# Patient Record
Sex: Male | Born: 1950 | Race: White | Hispanic: No | Marital: Single | Smoking: Former smoker
Health system: Southern US, Community
[De-identification: ages and names within clinical notes are randomized; demographics above are authoritative.]

## PROBLEM LIST (undated history)

## (undated) DIAGNOSIS — E7800 Pure hypercholesterolemia, unspecified: Secondary | ICD-10-CM

## (undated) DIAGNOSIS — C801 Malignant (primary) neoplasm, unspecified: Secondary | ICD-10-CM

## (undated) DIAGNOSIS — I10 Essential (primary) hypertension: Secondary | ICD-10-CM

## (undated) DIAGNOSIS — J45909 Unspecified asthma, uncomplicated: Secondary | ICD-10-CM

## (undated) HISTORY — PX: PLEURAL SCARIFICATION: SHX748

## (undated) HISTORY — PX: VASECTOMY: SHX75

---

## 2000-12-18 ENCOUNTER — Other Ambulatory Visit: Admission: RE | Admit: 2000-12-18 | Discharge: 2000-12-18 | Payer: Self-pay | Admitting: Dermatology

## 2012-12-24 ENCOUNTER — Encounter: Payer: Self-pay | Admitting: Nurse Practitioner

## 2013-07-16 HISTORY — PX: PROSTATE BIOPSY: SHX241

## 2013-07-30 ENCOUNTER — Encounter (HOSPITAL_COMMUNITY): Payer: Self-pay | Admitting: Emergency Medicine

## 2013-07-30 ENCOUNTER — Emergency Department (HOSPITAL_COMMUNITY): Payer: Non-veteran care

## 2013-07-30 ENCOUNTER — Emergency Department (HOSPITAL_COMMUNITY)
Admission: EM | Admit: 2013-07-30 | Discharge: 2013-07-30 | Disposition: A | Discharge status: Home / Self Care | Payer: Non-veteran care | Attending: Emergency Medicine | Admitting: Emergency Medicine

## 2013-07-30 DIAGNOSIS — R319 Hematuria, unspecified: Secondary | ICD-10-CM

## 2013-07-30 DIAGNOSIS — R109 Unspecified abdominal pain: Secondary | ICD-10-CM | POA: Insufficient documentation

## 2013-07-30 DIAGNOSIS — I10 Essential (primary) hypertension: Secondary | ICD-10-CM | POA: Insufficient documentation

## 2013-07-30 DIAGNOSIS — Z9889 Other specified postprocedural states: Secondary | ICD-10-CM

## 2013-07-30 DIAGNOSIS — F17200 Nicotine dependence, unspecified, uncomplicated: Secondary | ICD-10-CM | POA: Insufficient documentation

## 2013-07-30 DIAGNOSIS — R300 Dysuria: Secondary | ICD-10-CM | POA: Insufficient documentation

## 2013-07-30 HISTORY — DX: Essential (primary) hypertension: I10

## 2013-07-30 LAB — POCT I-STAT, CHEM 8
Calcium, Ion: 1.23 mmol/L (ref 1.13–1.30)
Chloride: 98 mEq/L (ref 96–112)
Creatinine, Ser: 1.2 mg/dL (ref 0.50–1.35)
Glucose, Bld: 81 mg/dL (ref 70–99)
HCT: 48 % (ref 39.0–52.0)
Potassium: 3.6 mEq/L (ref 3.5–5.1)
Sodium: 140 mEq/L (ref 135–145)
TCO2: 28 mmol/L (ref 0–100)

## 2013-07-30 LAB — URINE MICROSCOPIC-ADD ON

## 2013-07-30 LAB — URINALYSIS, ROUTINE W REFLEX MICROSCOPIC
Bilirubin Urine: NEGATIVE
Urobilinogen, UA: 0.2 mg/dL (ref 0.0–1.0)

## 2013-07-30 MED ORDER — SODIUM CHLORIDE 0.9 % IV SOLN
INTRAVENOUS | Status: DC
  Administered 2013-07-30: 16:00:00 via INTRAVENOUS

## 2013-07-30 MED ORDER — IOHEXOL 300 MG/ML  SOLN
100.0000 mL | Freq: Once | INTRAMUSCULAR | Status: AC | PRN
  Administered 2013-07-30: 100 mL via INTRAVENOUS

## 2013-07-30 MED ORDER — CIPROFLOXACIN HCL 500 MG PO TABS: 500.0000 mg | ORAL_TABLET | Freq: Two times a day (BID) | ORAL | Status: DC

## 2013-07-30 NOTE — ED Notes (Signed)
Pt reports had biopsy of prostate Nov 21 and since then has had burning with urination, pain in groin, and  Blood in urine.

## 2013-07-30 NOTE — ED Notes (Signed)
Pt c/o dysuria and hematuria since Nov 21. Pt states he had biopsy of prostate on that date and has had symptoms since. Pt also reports pain in groin area.

## 2013-07-30 NOTE — ED Provider Notes (Signed)
CSN: [id number]     Arrival date & time 07/30/13  1243 History   First MD Initiated Contact with Patient 07/30/13 1448     Chief Complaint  Patient presents with  . Hematuria  . Groin Pain  . Dysuria    HPI Pt was seen at 1455. Per pt, c/o gradual onset and persistence of constant pelvic "pain" and hematuria that began after his prostate biopsy on 07/16/13. Pt states he called his Uro MD yesterday and was told to go to the ED for evaluation. Pt describes his pelvic pain as "fullness," and his urine as "pink tinged." Denies flank pain, no N/V/D, no fevers, no testicular pain/swelling.    Uro: Dr. Caswell Corwin in [place] Past Medical History  Diagnosis Date  . Hypertension    Past Surgical History  Procedure Laterality Date  . Prostate biopsy  07/16/2013    Dr. Caswell Corwin in [place]  . Pleural scarification     Family History  Problem Relation Age of Onset  . Dementia Mother   . Hypertension Father    History  Substance Use Topics  . Smoking status: Current Every Day Smoker  . Smokeless tobacco: Not on file  . Alcohol Use: No    Review of Systems ROS: Statement: All systems negative except as marked or noted in the HPI; Constitutional: Negative for fever and chills. ; ; Eyes: Negative for eye pain, redness and discharge. ; ; ENMT: Negative for ear pain, hoarseness, nasal congestion, sinus pressure and sore throat. ; ; Cardiovascular: Negative for chest pain, palpitations, diaphoresis, dyspnea and peripheral edema. ; ; Respiratory: Negative for cough, wheezing and stridor. ; ; Gastrointestinal: Negative for nausea, vomiting, diarrhea, abdominal pain, blood in stool, hematemesis, jaundice and rectal bleeding. . ; ; Genitourinary: Negative for dysuria, flank pain. +pelvic pain, hematuria. ; ; Genital:  No penile drainage or rash, no testicular pain or swelling, no scrotal rash or swelling.;; Musculoskeletal: Negative for back pain and neck pain. Negative for swelling and trauma.; ; Skin:  Negative for pruritus, rash, abrasions, blisters, bruising and skin lesion.; ; Neuro: Negative for headache, lightheadedness and neck stiffness. Negative for weakness, altered level of consciousness , altered mental status, extremity weakness, paresthesias, involuntary movement, seizure and syncope.     Allergies  Review of patient's allergies indicates no known allergies.  Home Medications  No current outpatient prescriptions on file. BP 152/93  Pulse 96  Temp(Src) 98 F (36.7 C) (Oral)  Resp 18  Ht 5\' 11"  (1.803 m)  Wt 185 lb (83.915 kg)  BMI 25.81 kg/m2  SpO2 100% Physical Exam 1500: Physical examination:  Nursing notes reviewed; Vital signs and O2 SAT reviewed;  Constitutional: Well developed, Well nourished, Well hydrated, In no acute distress; Head:  Normocephalic, atraumatic; Eyes: EOMI, PERRL, No scleral icterus; ENMT: Mouth and pharynx normal, Mucous membranes moist; Neck: Supple, Full range of motion, No lymphadenopathy; Cardiovascular: Regular rate and rhythm, No gallop; Respiratory: Breath sounds clear & equal bilaterally, No wheezes.  Speaking full sentences with ease, Normal respiratory effort/excursion; Chest: Nontender, Movement normal; Abdomen: Soft, Nontender, Nondistended, Normal bowel sounds; Genitourinary: No CVA tenderness; Extremities: Pulses normal, No tenderness, No edema, No calf edema or asymmetry.; Neuro: AA&Ox3, Major CN grossly intact.  Speech clear. Climbs on and off stretcher easily by himself. Gait steady. No gross focal motor or sensory deficits in extremities.; Skin: Color normal, Warm, Dry.   ED Course  Procedures   EKG Interpretation   None       MDM  MDM Reviewed: previous chart, nursing note and vitals Reviewed previous: labs Interpretation: labs and CT scan      Results for orders placed during the hospital encounter of 07/30/13  URINALYSIS, ROUTINE W REFLEX MICROSCOPIC      Result Value Range   Color, Urine YELLOW  YELLOW    APPearance HAZY (*) CLEAR   Specific Gravity, Urine 1.025  1.005 - 1.030   pH 5.5  5.0 - 8.0   Glucose, UA NEGATIVE  NEGATIVE mg/dL   Hgb urine dipstick LARGE (*) NEGATIVE   Bilirubin Urine NEGATIVE  NEGATIVE   Ketones, ur NEGATIVE  NEGATIVE mg/dL   Protein, ur TRACE (*) NEGATIVE mg/dL   Urobilinogen, UA 0.2  0.0 - 1.0 mg/dL   Nitrite NEGATIVE  NEGATIVE   Leukocytes, UA NEGATIVE  NEGATIVE  URINE MICROSCOPIC-ADD ON      Result Value Range   WBC, UA 3-6  <3 WBC/hpf   RBC / HPF 21-50  <3 RBC/hpf   Bacteria, UA FEW (*) RARE  POCT I-STAT, CHEM 8      Result Value Range   Sodium 140  135 - 145 mEq/L   Potassium 3.6  3.5 - 5.1 mEq/L   Chloride 98  96 - 112 mEq/L   BUN 10  6 - 23 mg/dL   Creatinine, Ser 4.09  0.50 - 1.35 mg/dL   Glucose, Bld 81  70 - 99 mg/dL   Calcium, Ion 8.11  9.14 - 1.30 mmol/L   TCO2 28  0 - 100 mmol/L   Hemoglobin 16.3  13.0 - 17.0 g/dL   HCT 78.2  95.6 - 21.3 %   Ct Abdomen Pelvis W Contrast 07/30/2013   CLINICAL DATA:  Persistent dysuria and hematuria status post prostate biopsy  EXAM: CT ABDOMEN AND PELVIS WITH CONTRAST  TECHNIQUE: Multidetector CT imaging of the abdomen and pelvis was performed using the standard protocol following bolus administration of intravenous contrast.  CONTRAST:  OMNIPAQUE IOHEXOL 300 MG/ML  SOLN  COMPARISON:  None.  FINDINGS: Lower Chest: The lung bases are clear. Visualized cardiac structures are within normal limits for size. No pericardial effusion. Unremarkable visualized distal thoracic esophagus.  Abdomen: Unremarkable CT appearance of the stomach, duodenum, spleen, adrenal glands and pancreas. Dystrophic calcification in the periphery of the right hemi liver suggests old granulomatous disease. No other discrete hepatic lesion identified. Gallbladder is unremarkable. No intra or extrahepatic biliary ductal dilatation.  Insert skip kidneys. Simple renal cortical cyst in the anterior inferior left kidney.  No evidence of obstruction  or focal bowel wall thickening. Normal appendix in the right lower quadrant. The terminal ileum is unremarkable. Mild colonic diverticulosis without active inflammation. No free fluid or suspicious adenopathy.  Pelvis: The bladder is distended with urine. Prostatomegaly. The prostate gland measures 4.2 x 5.5 cm. There is prior to heterogeneity centrally within the gland. However, no periprostatic inflammation or hematoma. The seminal vesicles are unremarkable.  Bones/Soft Tissues: No acute fracture or aggressive appearing lytic or blastic osseous lesion. L5-S1 degenerative disc disease and facet arthropathy.  Vascular: Atherosclerotic vascular disease without significant stenosis or aneurysmal dilatation.  IMPRESSION: 1. Prostatomegaly with heterogeneity centrally in the gland which could reflect a small amount of post biopsy hematoma. No periprostatic inflammation or hematoma. 2. Otherwise, unremarkable CT scan of the abdomen and pelvis.   Electronically Signed   By: Malachy Moan M.D.   On: 07/30/2013 17:22    2000:  Pt wants to go home now. I attempted to call  pt's Uro Dr. Caswell Corwin at the [facility] in [place]; no call back x2 hours. T/C to Uro Dr. Jerre Simon, case discussed, including:  HPI, pertinent PM/SHx, VS/PE, dx testing, ED course and treatment:  States pt's symptoms are normal s/p prostate biopsy, start pt on cipro if he is not on it already and have pt f/u with his Uro MD next week; if he cannot be seen by his Uro MD, have pt f/u in his ofc. Dx and testing, as well as d/w Uro MD, d/w pt.  Questions answered.  Verb understanding, agreeable to d/c home with outpt f/u.    Laray Anger, DO 08/01/13 1914

## 2013-08-01 LAB — URINE CULTURE: Colony Count: NO GROWTH

## 2014-11-25 IMAGING — CT CT ABD-PELV W/ CM
2 of 5 series · 15 of 46 positions shown, 17 images · IV contrast (Omnipaque 300)
Comparison: None.

CLINICAL DATA: Persistent dysuria and hematuria status post
prostate biopsy

EXAM:
CT ABDOMEN AND PELVIS WITH CONTRAST
TECHNIQUE: Multidetector CT imaging of the abdomen and pelvis was performed
using the standard protocol following bolus administration of
intravenous contrast.
CONTRAST:  100mL OMNIPAQUE IOHEXOL 300 MG/ML  SOLN

[Series 2: abd_pel_with 5.0 b40f · axial · 0.72mm/px · z∈[-584,-144]mm · 12 of 100 slices shown, 14 images]
[im 6/100  soft-tissue]
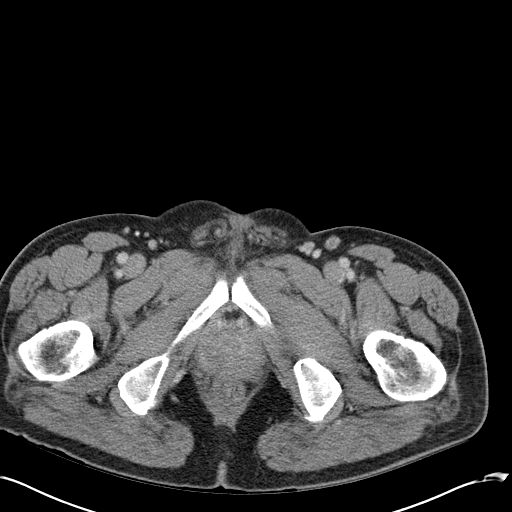
[im 6/100  bone]
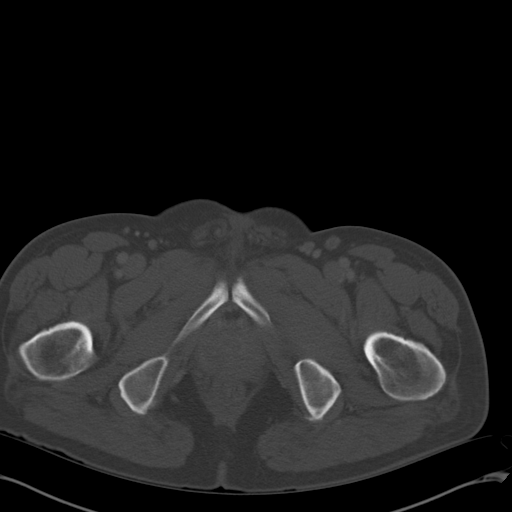
[im 18/100  soft-tissue]
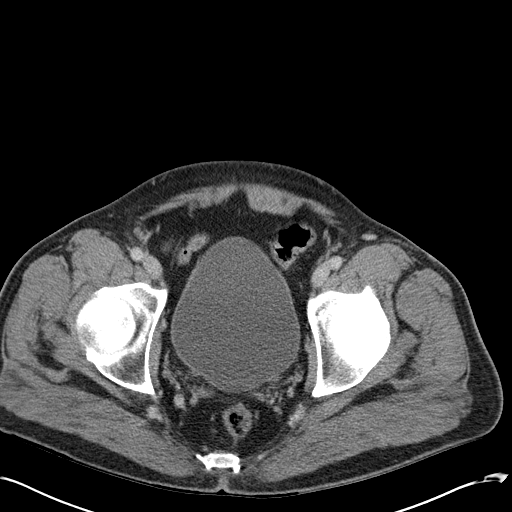
[im 24/100  soft-tissue]
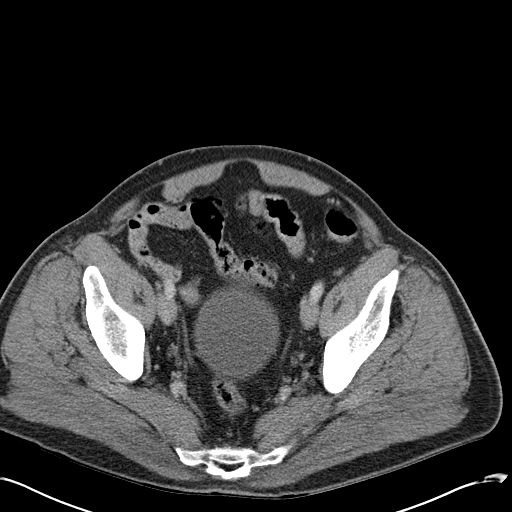
[im 30/100  soft-tissue]
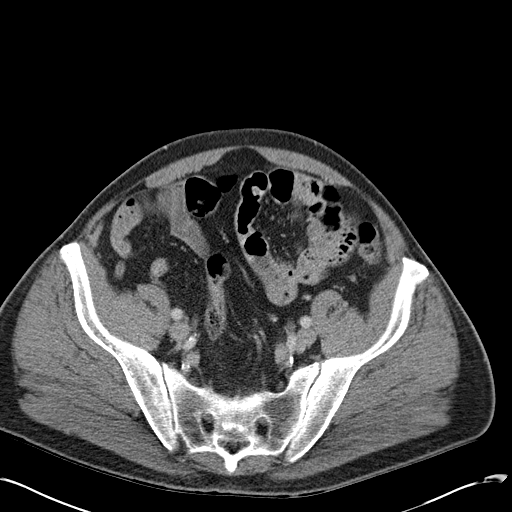
[im 41/100  soft-tissue]
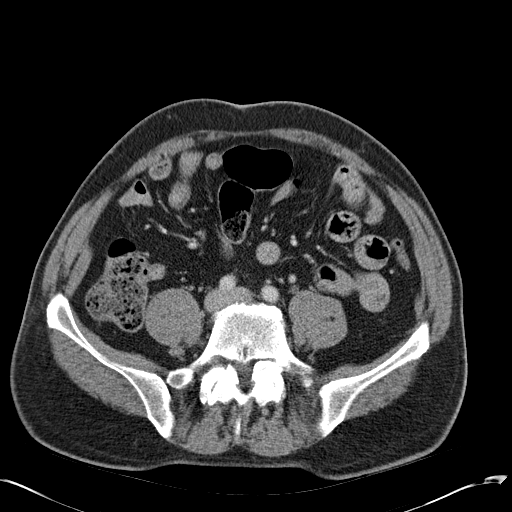
[im 47/100  soft-tissue]
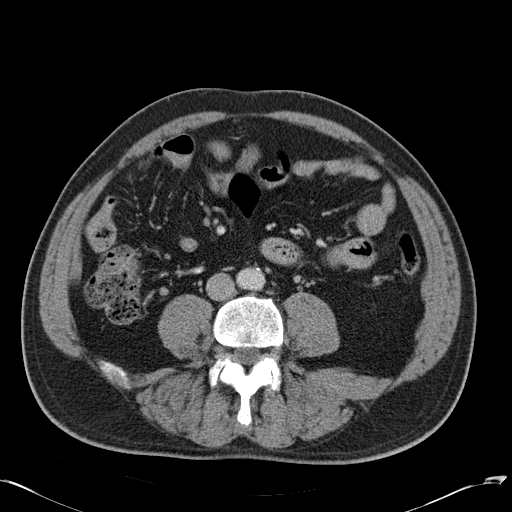
[im 53/100  soft-tissue]
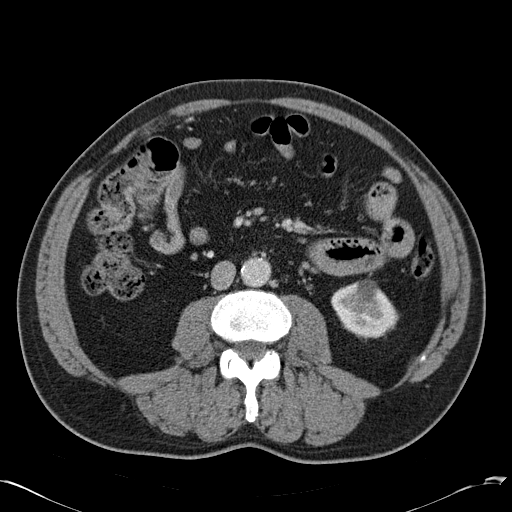
[im 65/100  soft-tissue]
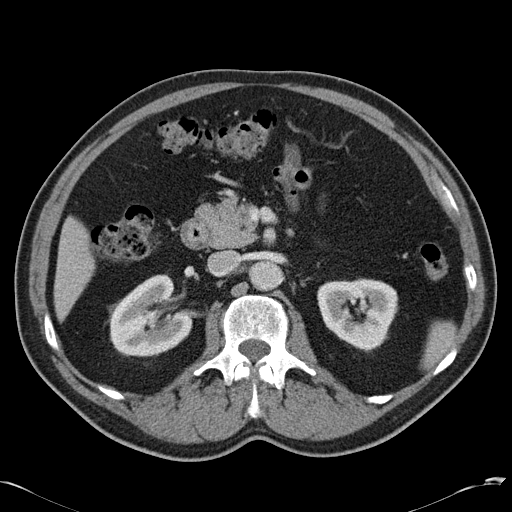
[im 70/100  soft-tissue]
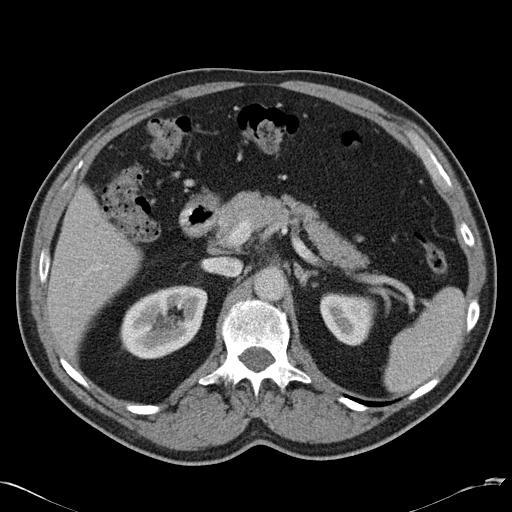
[im 70/100  bone]
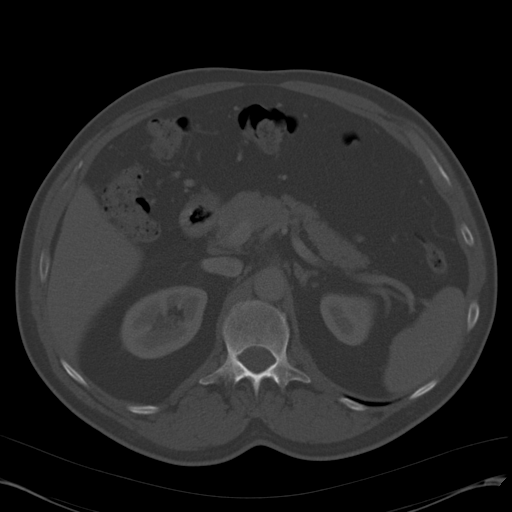
[im 76/100  soft-tissue]
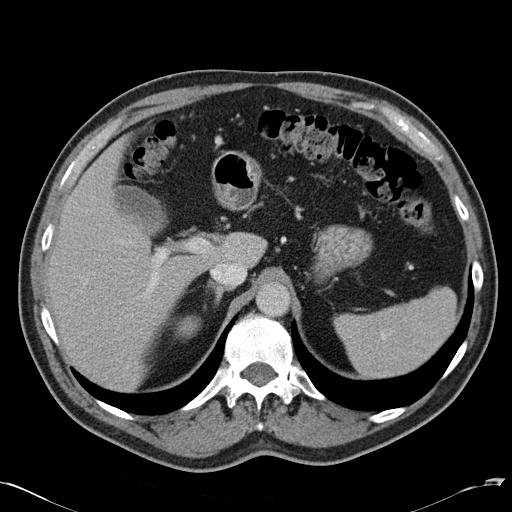
[im 88/100  soft-tissue]
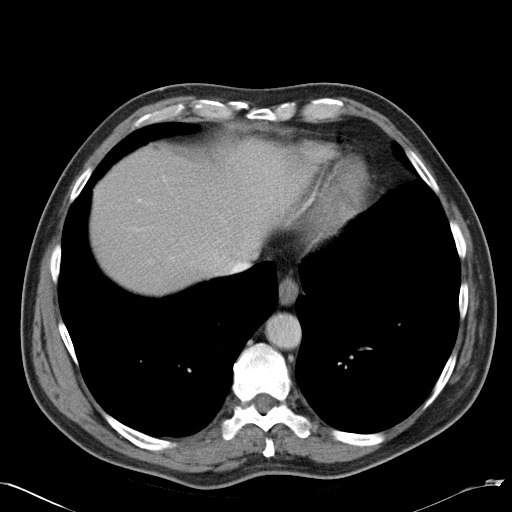
[im 94/100  soft-tissue]
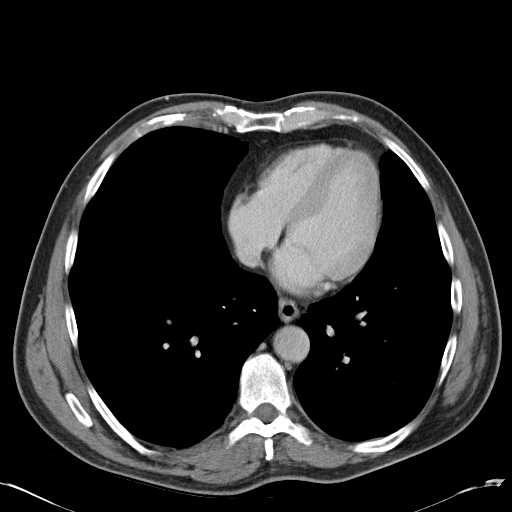

[Series 4: abd_pel_with 3.0 spo cor · coronal · 0.73mm/px · 3 of 93 slices shown]
[im 31/93  soft-tissue]
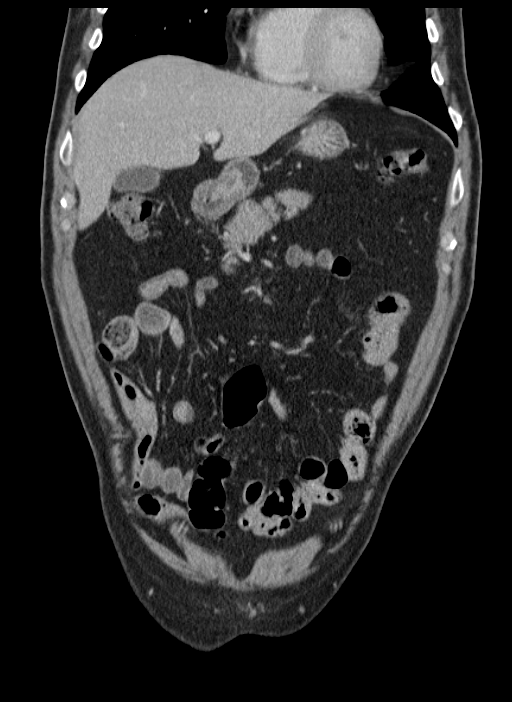
[im 41/93  soft-tissue]
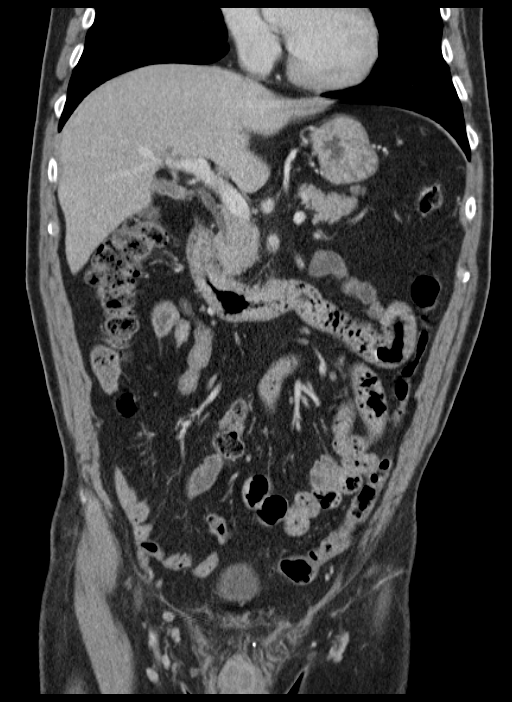
[im 52/93  soft-tissue]
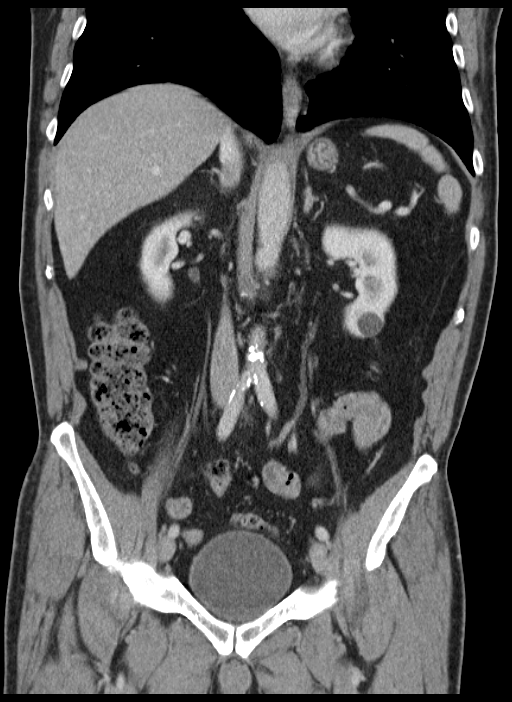

[15 of 46 positions shown; findings below may reference images not displayed]

FINDINGS: Lower Chest: The lung bases are clear. Visualized cardiac structures
are within normal limits for size. No pericardial effusion.
Unremarkable visualized distal thoracic esophagus.

Abdomen: Unremarkable CT appearance of the stomach, duodenum,
spleen, adrenal glands and pancreas. Dystrophic calcification in the
periphery of the right hemi liver suggests old granulomatous
disease. No other discrete hepatic lesion identified. Gallbladder is
unremarkable. No intra or extrahepatic biliary ductal dilatation.

Insert skip kidneys. Simple renal cortical cyst in the anterior
inferior left kidney.

No evidence of obstruction or focal bowel wall thickening. Normal
appendix in the right lower quadrant. The terminal ileum is
unremarkable. Mild colonic diverticulosis without active
inflammation. No free fluid or suspicious adenopathy.

Pelvis: The bladder is distended with urine. Prostatomegaly. The
prostate gland measures 4.2 x 5.5 cm. There is prior to
heterogeneity centrally within the gland. However, no periprostatic
inflammation or hematoma. The seminal vesicles are unremarkable.

Bones/Soft Tissues: No acute fracture or aggressive appearing lytic
or blastic osseous lesion. L5-S1 degenerative disc disease and facet
arthropathy.

Vascular: Atherosclerotic vascular disease without significant
stenosis or aneurysmal dilatation.
IMPRESSION: 1. Prostatomegaly with heterogeneity centrally in the gland which
could reflect a small amount of post biopsy hematoma. No
periprostatic inflammation or hematoma.
2. Otherwise, unremarkable CT scan of the abdomen and pelvis.

## 2020-01-04 ENCOUNTER — Other Ambulatory Visit: Payer: Self-pay

## 2020-01-04 ENCOUNTER — Emergency Department (HOSPITAL_COMMUNITY)
Admission: EM | Admit: 2020-01-04 | Discharge: 2020-01-04 | Disposition: A | Discharge status: Home / Self Care | Payer: No Typology Code available for payment source | Attending: Emergency Medicine | Admitting: Emergency Medicine

## 2020-01-04 ENCOUNTER — Encounter (HOSPITAL_COMMUNITY): Payer: Self-pay

## 2020-01-04 DIAGNOSIS — R319 Hematuria, unspecified: Secondary | ICD-10-CM | POA: Diagnosis not present

## 2020-01-04 DIAGNOSIS — Z8546 Personal history of malignant neoplasm of prostate: Secondary | ICD-10-CM | POA: Insufficient documentation

## 2020-01-04 DIAGNOSIS — Z79899 Other long term (current) drug therapy: Secondary | ICD-10-CM | POA: Insufficient documentation

## 2020-01-04 DIAGNOSIS — I10 Essential (primary) hypertension: Secondary | ICD-10-CM | POA: Insufficient documentation

## 2020-01-04 DIAGNOSIS — Z87891 Personal history of nicotine dependence: Secondary | ICD-10-CM | POA: Insufficient documentation

## 2020-01-04 DIAGNOSIS — Z7982 Long term (current) use of aspirin: Secondary | ICD-10-CM | POA: Diagnosis not present

## 2020-01-04 DIAGNOSIS — R39198 Other difficulties with micturition: Secondary | ICD-10-CM | POA: Diagnosis not present

## 2020-01-04 DIAGNOSIS — Z978 Presence of other specified devices: Secondary | ICD-10-CM

## 2020-01-04 HISTORY — DX: Pure hypercholesterolemia, unspecified: E78.00

## 2020-01-04 LAB — URINALYSIS, ROUTINE W REFLEX MICROSCOPIC
Glucose, UA: 100 mg/dL — AB
Ketones, ur: 15 mg/dL — AB
Nitrite: POSITIVE — AB
Protein, ur: >300 mg/dL — AB
Specific Gravity, Urine: 1.02 (ref 1.005–1.030)
pH: 6.5 (ref 5.0–8.0)

## 2020-01-04 LAB — URINALYSIS, MICROSCOPIC (REFLEX): RBC / HPF: >50 RBC/hpf (ref 0–5)

## 2020-01-04 NOTE — ED Provider Notes (Signed)
[facility] EMERGENCY DEPARTMENT Provider Note   CSN: [id number] Arrival date & time: 01/04/20  1506     History Chief Complaint  Patient presents with  . Hematuria    DRAYSEN LOWERY is a 69 y.o. male.  HPI   This patient is a 69 year old male, history of hypertension and hypercholesterolemia as well as a history of prostate cancer, this patient has had prior prostate radiation therapy last getting doses about 5 or 6 years ago.  He presents with hematuria, when he woke up this morning he was able to pass urine but shortly thereafter has been dribbling dark blood clots from his penis, he is not able to maintain a stream, he has some discomfort in his lower abdomen but no nausea or vomiting, no fevers or chills, no dysuria.  He has never had that this amount of bleeding though he does report clinically having chronic mild hematuria.  He denies any back pain, denies any swelling of his legs, denies any pain in his scrotum penis or testicles.  He is followed by Dr. Aretha Parrot with urology at the [facility] in [place]  Past Medical History:  Diagnosis Date  . Hypercholesteremia   . Hypertension     There are no problems to display for this patient.   Past Surgical History:  Procedure Laterality Date  . PLEURAL SCARIFICATION    . PROSTATE BIOPSY  07/16/2013   Dr. Aretha Parrot in [place]  . VASECTOMY         Family History  Problem Relation Age of Onset  . Dementia Mother   . Hypertension Father     Social History   Tobacco Use  . Smoking status: Former Research scientist (life sciences)  . Smokeless tobacco: Former Systems developer    Quit date: 01/03/2002  Substance Use Topics  . Alcohol use: No  . Drug use: No    Home Medications Prior to Admission medications   Medication Sig Start Date End Date Taking? Authorizing Provider  albuterol (PROVENTIL HFA) 108 (90 BASE) MCG/ACT inhaler Inhale 1 puff into the lungs every 6 (six) hours as needed for wheezing or shortness of breath.    [provider]    aspirin EC 81 MG tablet Take 81 mg by mouth daily.    [provider]  ciprofloxacin (CIPRO) 500 MG tablet Take 1 tablet (500 mg total) by mouth 2 (two) times daily. 07/30/13   Francine Graven, DO  hydrochlorothiazide (HYDRODIURIL) 25 MG tablet Take 12.5 mg by mouth every morning.    [provider]  Saw Palmetto, Serenoa repens, (SAW PALMETTO BERRIES PO) Take 1 tablet by mouth every morning.    [provider]  simvastatin (ZOCOR) 20 MG tablet Take 20 mg by mouth at bedtime.    [provider]    Allergies    Patient has no known allergies.  Review of Systems   Review of Systems  All other systems reviewed and are negative.   Physical Exam Updated Vital Signs BP (!) 166/93 (BP Location: Right Arm)   Pulse 87   Temp 97.8 F (36.6 C) (Oral)   Resp 18   Ht 1.803 m (5\' 11" )   Wt 88.5 kg   SpO2 94%   BMI 27.20 kg/m   Physical Exam Vitals and nursing note reviewed.  Constitutional:      General: He is not in acute distress.    Appearance: He is well-developed.  HENT:     Head: Normocephalic and atraumatic.     Mouth/Throat:  Pharynx: No oropharyngeal exudate.  Eyes:     General: No scleral icterus.       Right eye: No discharge.        Left eye: No discharge.     Conjunctiva/sclera: Conjunctivae normal.     Pupils: Pupils are equal, round, and reactive to light.  Neck:     Thyroid: No thyromegaly.     Vascular: No JVD.  Cardiovascular:     Rate and Rhythm: Normal rate and regular rhythm.     Heart sounds: Normal heart sounds. No murmur. No friction rub. No gallop.   Pulmonary:     Effort: Pulmonary effort is normal. No respiratory distress.     Breath sounds: Normal breath sounds. No wheezing or rales.  Abdominal:     General: Bowel sounds are normal. There is no distension.     Palpations: Abdomen is soft. There is no mass.     Tenderness: There is abdominal tenderness.     Comments: Minimal tenderness in the suprapubic  area with some fullness  Genitourinary:    Comments: Normal-appearing penis scrotum and testicles except for small amount of dried blood at the urethral meatus Musculoskeletal:        General: No tenderness. Normal range of motion.     Cervical back: Normal range of motion and neck supple.  Lymphadenopathy:     Cervical: No cervical adenopathy.  Skin:    General: Skin is warm and dry.     Findings: No erythema or rash.  Neurological:     Mental Status: He is alert.     Coordination: Coordination normal.  Psychiatric:        Behavior: Behavior normal.     ED Results / Procedures / Treatments   Labs (all labs ordered are listed, but only abnormal results are displayed) Labs Reviewed  URINE CULTURE  URINALYSIS, ROUTINE W REFLEX MICROSCOPIC    EKG None  Radiology No results found.  Procedures Procedures (including critical care time)  Medications Ordered in ED Medications - No data to display  ED Course  I have reviewed the triage vital signs and the nursing notes.  Pertinent labs & imaging results that were available during my care of the patient were reviewed by me and considered in my medical decision making (see chart for details).    MDM Rules/Calculators/A&P                       Likely related to prostate inflammation from radiation.  At this time I would recommend that the patient have a Foley catheter with irrigation of his bladder, this will need to be kept in place and the patient will follow up with urology, he is agreeable, he has no other signs of infection, urine culture sent.  Anticipate discharge  Foley catheter placed and irrigated, patient aware of how to handle this, education given at the bedside, stable for discharge to follow-up with urology.  Final Clinical Impression(s) / ED Diagnoses Final diagnoses:  Hematuria, unspecified type  Foley catheter in place    Rx / DC Orders ED Discharge Orders    None       Noemi Chapel,  MD 01/04/20 1814

## 2020-01-04 NOTE — Discharge Instructions (Signed)
Please see Dr. Aretha Parrot in the office - in the next 3-5 days ER for worsening symptoms.

## 2020-01-04 NOTE — ED Notes (Signed)
Irrigated catheter. Received many small blood clots. Catheter is draining at this time.

## 2020-01-04 NOTE — ED Triage Notes (Signed)
Pt presents to ED with complaints of blood in urine started this am. Pt states also having difficulty passing urine today.

## 2020-01-05 LAB — URINE CULTURE: Culture: NO GROWTH

## 2022-02-21 ENCOUNTER — Encounter: Payer: Self-pay | Admitting: *Deleted

## 2022-03-05 ENCOUNTER — Encounter: Payer: Self-pay | Admitting: *Deleted

## 2022-03-05 NOTE — Patient Instructions (Addendum)
Referring MD/PCP: Otelia Sergeant  Procedure: Colonoscopy  Has patient had this procedure before?  2018, had polyps removed (I did call pt to see where this was done at but had to [facility])  If so, when, by whom and where?    Is there a family history of colon cancer?  no  Who?  What age when diagnosed?    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  no  Do you have a pacemaker/defibrillator?  no  Has patient ever had endocarditis/atrial fibrillation? no  Does patient use oxygen? no  Has patient had joint replacement within last 12 months?  no  Is patient constipated or do they take laxatives? no  Does patient have a history of alcohol/drug use?  no  Have you had a stroke/heart attack last 6 mths? no  Do you take medicine for weight loss?  no  Is patient on blood thinner such as Coumadin, Plavix and/or Aspirin? no  Medications:  Current Outpatient Medications on File Prior to Visit  Medication Sig Dispense Refill   Fluticasone Propionate, Inhal, 100 MCG/ACT AEPB Inhale into the lungs in the morning and at bedtime.     lisinopril (ZESTRIL) 10 MG tablet Take 10 mg by mouth daily.     simvastatin (ZOCOR) 40 MG tablet Take 40 mg by mouth at bedtime.     tamsulosin (FLOMAX) 0.4 MG CAPS capsule Take 0.4 mg by mouth daily.     No current facility-administered medications on file prior to visit.     Allergies: No Known Allergies   Estimated body mass index is 27.2 kg/m as calculated from the following:   Height as of this encounter: '5\' 11"'$  (1.803 m).   Weight as of this encounter: 195 lb (88.5 kg).

## 2022-03-06 ENCOUNTER — Encounter: Payer: Self-pay | Admitting: *Deleted

## 2022-03-06 MED ORDER — PEG 3350-KCL-NA BICARB-NACL 420 G PO SOLR: ORAL | 0 refills | Status: AC

## 2022-03-06 NOTE — Progress Notes (Addendum)
Colonoscopy in 2018 in [place] with sessile 2 mm polyp (tubular adenoma). Diverticulosis.   ASA 2.

## 2022-03-06 NOTE — Progress Notes (Signed)
Called pt. Scheduled for 8/24 at 9am with Dr. Gala Romney. Aware will mail instructions. Rx sent to [facility].

## 2022-04-18 ENCOUNTER — Encounter (HOSPITAL_COMMUNITY)
Admission: RE | Disposition: A | Discharge status: Home / Self Care | Payer: Self-pay | Source: Home / Self Care | Attending: Internal Medicine

## 2022-04-18 ENCOUNTER — Other Ambulatory Visit: Payer: Self-pay

## 2022-04-18 ENCOUNTER — Ambulatory Visit (HOSPITAL_COMMUNITY)
Admission: RE | Admit: 2022-04-18 | Discharge: 2022-04-18 | Disposition: A | Discharge status: Home / Self Care | Payer: No Typology Code available for payment source | Attending: Internal Medicine | Admitting: Internal Medicine

## 2022-04-18 ENCOUNTER — Ambulatory Visit (HOSPITAL_COMMUNITY): Payer: No Typology Code available for payment source | Admitting: Anesthesiology

## 2022-04-18 ENCOUNTER — Encounter (HOSPITAL_COMMUNITY): Payer: Self-pay | Admitting: Internal Medicine

## 2022-04-18 ENCOUNTER — Ambulatory Visit (HOSPITAL_BASED_OUTPATIENT_CLINIC_OR_DEPARTMENT_OTHER): Payer: No Typology Code available for payment source | Admitting: Anesthesiology

## 2022-04-18 DIAGNOSIS — Z87891 Personal history of nicotine dependence: Secondary | ICD-10-CM | POA: Diagnosis not present

## 2022-04-18 DIAGNOSIS — I10 Essential (primary) hypertension: Secondary | ICD-10-CM | POA: Diagnosis not present

## 2022-04-18 DIAGNOSIS — K635 Polyp of colon: Secondary | ICD-10-CM | POA: Diagnosis not present

## 2022-04-18 DIAGNOSIS — K5730 Diverticulosis of large intestine without perforation or abscess without bleeding: Secondary | ICD-10-CM | POA: Diagnosis not present

## 2022-04-18 DIAGNOSIS — Z09 Encounter for follow-up examination after completed treatment for conditions other than malignant neoplasm: Secondary | ICD-10-CM | POA: Diagnosis present

## 2022-04-18 DIAGNOSIS — Z86010 Personal history of colonic polyps: Secondary | ICD-10-CM | POA: Diagnosis not present

## 2022-04-18 DIAGNOSIS — J45909 Unspecified asthma, uncomplicated: Secondary | ICD-10-CM | POA: Insufficient documentation

## 2022-04-18 DIAGNOSIS — D120 Benign neoplasm of cecum: Secondary | ICD-10-CM | POA: Diagnosis not present

## 2022-04-18 HISTORY — PX: COLONOSCOPY WITH PROPOFOL: SHX5780

## 2022-04-18 HISTORY — DX: Malignant (primary) neoplasm, unspecified: C80.1

## 2022-04-18 HISTORY — PX: POLYPECTOMY: SHX5525

## 2022-04-18 HISTORY — DX: Unspecified asthma, uncomplicated: J45.909

## 2022-04-18 SURGERY — COLONOSCOPY WITH PROPOFOL
Anesthesia: General

## 2022-04-18 MED ORDER — PROPOFOL 500 MG/50ML IV EMUL
INTRAVENOUS | Status: DC | PRN
  Administered 2022-04-18: 150 ug/kg/min via INTRAVENOUS

## 2022-04-18 MED ORDER — LIDOCAINE HCL 1 % IJ SOLN
INTRAMUSCULAR | Status: DC | PRN
  Administered 2022-04-18: 50 mg via INTRADERMAL

## 2022-04-18 MED ORDER — PROPOFOL 10 MG/ML IV BOLUS
INTRAVENOUS | Status: DC | PRN
  Administered 2022-04-18: 20 mg via INTRAVENOUS
  Administered 2022-04-18: 60 mg via INTRAVENOUS
  Administered 2022-04-18: 20 mg via INTRAVENOUS

## 2022-04-18 MED ORDER — LACTATED RINGERS IV SOLN: INTRAVENOUS | Status: DC

## 2022-04-18 NOTE — Op Note (Signed)
[facility] Patient Name: Nicholas Krause Procedure Date: 04/18/2022 8:54 AM MRN: [id number] Date of Birth: 01-19-1951 Attending MD: Norvel Richards , MD CSN: [id number] Age: 71 Admit Type: Outpatient Procedure:                Colonoscopy Indications:              High risk colon cancer surveillance: Personal                            history of colonic polyps Providers:                Norvel Richards, MD, Lambert Mody,                            Kristine L. Risa Grill, Technician, Bethel Born, [employer] Referring MD:              Medicines:                Propofol per Anesthesia Complications:            No immediate complications. Estimated Blood Loss:     Estimated blood loss was minimal. Procedure:                Pre-Anesthesia Assessment:                           - Prior to the procedure, a History and Physical                            was performed, and patient medications and                            allergies were reviewed. The patient's tolerance of                            previous anesthesia was also reviewed. The risks                            and benefits of the procedure and the sedation                            options and risks were discussed with the patient.                            All questions were answered, and informed consent                            was obtained. Prior Anticoagulants: The patient has                            taken no previous anticoagulant or antiplatelet                            agents. ASA  Grade Assessment: II - A patient with                            mild systemic disease. After reviewing the risks                            and benefits, the patient was deemed in                            satisfactory condition to undergo the procedure.                           After obtaining informed consent, the colonoscope                            was passed under direct  vision. Throughout the                            procedure, the patient's blood pressure, pulse, and                            oxygen saturations were monitored continuously. The                            [id number]) scope was introduced through the                            anus and advanced to the the cecum, identified by                            appendiceal orifice and ileocecal valve. The                            colonoscopy was performed without difficulty. The                            patient tolerated the procedure well. The quality                            of the bowel preparation was adequate. Scope In: 9:23:06 AM Scope Out: 9:38:39 AM Scope Withdrawal Time: 0 hours 10 minutes 17 seconds  Total Procedure Duration: 0 hours 15 minutes 33 seconds  Findings:      The perianal and digital rectal examinations were normal.      A 3 mm polyp was found in the cecum. The polyp was sessile. The polyp       was removed with a cold biopsy forceps. Resection and retrieval were       complete. Estimated blood loss was minimal. Estimated blood loss was       minimal.      The exam was otherwise without abnormality on direct and retroflexion       views.      Multiple small and large-mouthed diverticula were found in the sigmoid       colon and descending colon. Impression:               -  One 3 mm polyp in the cecum, removed with a cold                            biopsy forceps. Resected and retrieved.                           - The examination was otherwise normal on direct                            and retroflexion views.                           - Diverticulosis in the sigmoid colon and in the                            descending colon. Moderate Sedation:      Moderate (conscious) sedation was personally administered by an       anesthesia professional. The following parameters were monitored: oxygen       saturation, heart rate, blood pressure, and response to  care. Recommendation:           - Patient has a contact number available for                            emergencies. The signs and symptoms of potential                            delayed complications were discussed with the                            patient. Return to normal activities tomorrow.                            Written discharge instructions were provided to the                            patient.                           - Resume previous diet.                           - Continue present medications.                           - Repeat colonoscopy date to be determined after                            pending pathology results are reviewed for                            surveillance.                           - Return to GI office (date not yet determined). Procedure Code(s):        --- Professional ---  45380, Colonoscopy, flexible; with biopsy, single                            or multiple Diagnosis Code(s):        --- Professional ---                           Z86.010, Personal history of colonic polyps                           K63.5, Polyp of colon CPT copyright 2019 American Medical Association. All rights reserved. The codes documented in this report are preliminary and upon coder review may  be revised to meet current compliance requirements. Cristopher Estimable. Shenice Dolder, MD Norvel Richards, MD 04/18/2022 10:34:07 AM This report has been signed electronically. Number of Addenda: 0

## 2022-04-18 NOTE — Addendum Note (Signed)
Addendum  created 04/18/22 1044 by Ollen Bowl, CRNA   Clinical Note Signed

## 2022-04-18 NOTE — Anesthesia Preprocedure Evaluation (Signed)
Anesthesia Evaluation  Patient identified by MRN, date of birth, ID band Patient awake    Reviewed: Allergy & Precautions, H&P , NPO status , Patient's Chart, lab work & pertinent test results, reviewed documented beta blocker date and time   Airway Mallampati: II  TM Distance: >3 FB Neck ROM: full    Dental no notable dental hx.    Pulmonary asthma , former smoker,    Pulmonary exam normal breath sounds clear to auscultation       Cardiovascular Exercise Tolerance: Good hypertension, negative cardio ROS   Rhythm:regular Rate:Normal     Neuro/Psych negative neurological ROS  negative psych ROS   GI/Hepatic negative GI ROS, Neg liver ROS,   Endo/Other  negative endocrine ROS  Renal/GU negative Renal ROS  negative genitourinary   Musculoskeletal   Abdominal   Peds  Hematology negative hematology ROS (+)   Anesthesia Other Findings   Reproductive/Obstetrics negative OB ROS                             Anesthesia Physical Anesthesia Plan  ASA: 2  Anesthesia Plan: General   Post-op Pain Management:    Induction:   PONV Risk Score and Plan: Propofol infusion  Airway Management Planned:   Additional Equipment:   Intra-op Plan:   Post-operative Plan:   Informed Consent: I have reviewed the patients History and Physical, chart, labs and discussed the procedure including the risks, benefits and alternatives for the proposed anesthesia with the patient or authorized representative who has indicated his/her understanding and acceptance.     Dental Advisory Given  Plan Discussed with: CRNA  Anesthesia Plan Comments:         Anesthesia Quick Evaluation

## 2022-04-18 NOTE — Discharge Instructions (Signed)
  Colonoscopy Discharge Instructions  Read the instructions outlined below and refer to this sheet in the next few weeks. These discharge instructions provide you with general information on caring for yourself after you leave the hospital. Your doctor may also give you specific instructions. While your treatment has been planned according to the most current medical practices available, unavoidable complications occasionally occur. If you have any problems or questions after discharge, call Dr. Gala Romney at [phone]. ACTIVITY You may resume your regular activity, but move at a slower pace for the next 24 hours.  Take frequent rest periods for the next 24 hours.  Walking will help get rid of the air and reduce the bloated feeling in your belly (abdomen).  No driving for 24 hours (because of the medicine (anesthesia) used during the test).   Do not sign any important legal documents or operate any machinery for 24 hours (because of the anesthesia used during the test).  NUTRITION Drink plenty of fluids.  You may resume your normal diet as instructed by your doctor.  Begin with a light meal and progress to your normal diet. Heavy or fried foods are harder to digest and may make you feel sick to your stomach (nauseated).  Avoid alcoholic beverages for 24 hours or as instructed.  MEDICATIONS You may resume your normal medications unless your doctor tells you otherwise.  WHAT YOU CAN EXPECT TODAY Some feelings of bloating in the abdomen.  Passage of more gas than usual.  Spotting of blood in your stool or on the toilet paper.  IF YOU HAD POLYPS REMOVED DURING THE COLONOSCOPY: No aspirin products for 7 days or as instructed.  No alcohol for 7 days or as instructed.  Eat a soft diet for the next 24 hours.  FINDING OUT THE RESULTS OF YOUR TEST Not all test results are available during your visit. If your test results are not back during the visit, make an appointment with your caregiver to find out the  results. Do not assume everything is normal if you have not heard from your caregiver or the medical facility. It is important for you to follow up on all of your test results.  SEEK IMMEDIATE MEDICAL ATTENTION IF: You have more than a spotting of blood in your stool.  Your belly is swollen (abdominal distention).  You are nauseated or vomiting.  You have a temperature over 101.  You have abdominal pain or discomfort that is severe or gets worse throughout the day.     1 small polyp removed from your colon today  Colon polyp and diverticulosis information provided  Further recommendations to follow pending review of pathology report  At patient request, I called Kizzie Ide at [phone] -reviewed findings and recommendations

## 2022-04-18 NOTE — Transfer of Care (Addendum)
Immediate Anesthesia Transfer of Care Note  Patient: Nicholas Krause  Procedure(s) Performed: COLONOSCOPY WITH PROPOFOL POLYPECTOMY  Patient Location: Endoscopy Unit  Anesthesia Type:General  Level of Consciousness: awake  Airway & Oxygen Therapy: Patient Spontanous Breathing  Post-op Assessment: Report given to RN  Post vital signs: Reviewed and stable  Last Vitals:  Vitals Value Taken Time  BP 97/44   Temp 36.4   Pulse 69   Resp 17   SpO2 97     Last Pain:  Vitals:   04/18/22 0918  TempSrc:   PainSc: 0-No pain      Patients Stated Pain Goal: 8 (71/25/27 1292)  Complications: No notable events documented.

## 2022-04-18 NOTE — Anesthesia Postprocedure Evaluation (Signed)
Anesthesia Post Note  Patient: Nicholas Krause  Procedure(s) Performed: COLONOSCOPY WITH PROPOFOL POLYPECTOMY  Patient location during evaluation: Endoscopy Anesthesia Type: General Level of consciousness: awake and alert Pain management: pain level controlled Vital Signs Assessment: post-procedure vital signs reviewed and stable Respiratory status: spontaneous breathing Cardiovascular status: blood pressure returned to baseline and stable Postop Assessment: no apparent nausea or vomiting Anesthetic complications: no   No notable events documented.   Last Vitals:  Vitals:   04/18/22 0738  BP: (!) 144/70  Pulse: 61  Resp: 12  Temp: 36.4 C  SpO2: 95%    Last Pain:  Vitals:   04/18/22 0918  TempSrc:   PainSc: 0-No pain                 Celestina Gironda

## 2022-04-18 NOTE — H&P (Signed)
$'@LOGO'U$ @   Primary Care Physician:  Duffy Rhody, MD Primary Gastroenterologist:  Dr. Gala Romney  Pre-Procedure History & Physical: HPI:  Nicholas Krause is a 71 y.o. male here for for surveillance colonoscopy.  History of small adenoma removed in [facility] 2019; no bowel symptoms.  Past Medical History:  Diagnosis Date   Asthma    Cancer [facility])    prostate   Hypercholesteremia    Hypertension     Past Surgical History:  Procedure Laterality Date   PLEURAL SCARIFICATION     PROSTATE BIOPSY  07/16/2013   Dr. Aretha Parrot in [place]      Prior to Admission medications   Medication Sig Start Date End Date Taking? Authorizing Provider  b complex vitamins capsule Take 1 capsule by mouth daily.   Yes [provider]  cholecalciferol (VITAMIN D3) 25 MCG (1000 UNIT) tablet Take 1,000 Units by mouth daily.   Yes [provider]  fluocinonide cream (LIDEX) 4.48 % Apply 1 Application topically 2 (two) times daily as needed (itching).   Yes [provider]  fluticasone-salmeterol (WIXELA INHUB) 100-50 MCG/ACT AEPB Inhale 1 puff into the lungs 2 (two) times daily.   Yes [provider]  lisinopril (ZESTRIL) 10 MG tablet Take 10 mg by mouth daily.   Yes [provider]  Multiple Vitamins-Minerals (MULTIVITAMIN WITH MINERALS) tablet Take 1 tablet by mouth daily.   Yes [provider]  simvastatin (ZOCOR) 40 MG tablet Take 40 mg by mouth at bedtime.   Yes [provider]  tamsulosin (FLOMAX) 0.4 MG CAPS capsule Take 0.4 mg by mouth daily.   Yes [provider]  albuterol (VENTOLIN HFA) 108 (90 Base) MCG/ACT inhaler Inhale 2 puffs into the lungs every 4 (four) hours as needed for wheezing or shortness of breath.    [provider]  polyethylene glycol-electrolytes (NULYTELY) 420 g solution As directed 03/06/22   Zilah Villaflor, Cristopher Estimable, MD    Allergies as of 03/06/2022   (No Known Allergies)     Family History  Problem Relation Age of Onset   Dementia Mother    Hypertension Father     Social History   Socioeconomic History   Marital status: Single    Spouse name: Not on file   Number of children: Not on file   Years of education: Not on file   Highest education level: Not on file  Occupational History   Not on file  Tobacco Use   Smoking status: Former   Smokeless tobacco: Former    Quit date: 01/03/2002  Vaping Use   Vaping Use: Never used  Substance and Sexual Activity   Alcohol use: No   Drug use: No   Sexual activity: Not on file  Other Topics Concern   Not on file  Social History Narrative   Not on file   Social Determinants of Health   Financial Resource Strain: Not on file  Food Insecurity: Not on file  Transportation Needs: Not on file  Physical Activity: Not on file  Stress: Not on file  Social Connections: Not on file  Intimate Partner Violence: Not on file    Review of Systems: See HPI, otherwise negative ROS  Physical Exam: BP (!) 144/70   Pulse 61   Temp 97.6 F (36.4 C) (Oral)   Resp 12   Ht '5\' 11"'$  (1.803 m)   Wt 87.1 kg   SpO2 95%   BMI 26.78 kg/m  General:  Alert,  Well-developed, well-nourished, pleasant and cooperative in NAD Neck:  Supple; no masses or thyromegaly. No significant cervical adenopathy. Lungs:  Clear throughout to auscultation.   No wheezes, crackles, or rhonchi. No acute distress. Heart:  Regular rate and rhythm; no murmurs, clicks, rubs,  or gallops. Abdomen: Non-distended, normal bowel sounds.  Soft and nontender without appreciable mass or hepatosplenomegaly.  Pulses:  Normal pulses noted. Extremities:  Without clubbing or edema.  Impression/Plan: 71 year old gentleman with personal history colonic adenoma; here for surveillance colonoscopy per plan. The risks, benefits, limitations, alternatives and imponderables have been reviewed with the patient. Questions have been answered. All parties are  agreeable.       Notice: This dictation was prepared with Dragon dictation along with smaller phrase technology. Any transcriptional errors that result from this process are unintentional and may not be corrected upon review.

## 2022-04-23 LAB — SURGICAL PATHOLOGY

## 2022-04-24 ENCOUNTER — Encounter (HOSPITAL_COMMUNITY): Payer: Self-pay | Admitting: Internal Medicine

## 2022-05-02 ENCOUNTER — Encounter: Payer: Self-pay | Admitting: Internal Medicine
